# Patient Record
Sex: Female | Born: 2000 | Race: Black or African American | Hispanic: No | Marital: Single
Health system: Southern US, Community
[De-identification: ages and names within clinical notes are randomized; demographics above are authoritative.]

## PROBLEM LIST (undated history)

## (undated) DIAGNOSIS — Z789 Other specified health status: Secondary | ICD-10-CM

---
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
## 2022-09-28 ENCOUNTER — Encounter (HOSPITAL_COMMUNITY): Payer: Self-pay

## 2022-09-28 ENCOUNTER — Emergency Department (HOSPITAL_COMMUNITY)
Admission: EM | Admit: 2022-09-28 | Discharge: 2022-09-28 | Disposition: A | Discharge status: Home / Self Care | Payer: Medicaid Other | Attending: Emergency Medicine | Admitting: Emergency Medicine

## 2022-09-28 ENCOUNTER — Other Ambulatory Visit: Payer: Self-pay

## 2022-09-28 DIAGNOSIS — J029 Acute pharyngitis, unspecified: Secondary | ICD-10-CM | POA: Insufficient documentation

## 2022-09-28 LAB — GROUP A STREP BY PCR: Group A Strep by PCR: NOT DETECTED

## 2022-09-28 NOTE — ED Provider Notes (Signed)
[facility] AT [facility] Provider Note   CSN: [id number] Arrival date & time: 09/28/22  1602     History  Chief Complaint  Patient presents with   Sore Throat    Heather Ware is a 22 y.o. female.  Patient is a 22 year old female with no significant medical history who is presenting today with very minimal sore throat but when she looked in her throat today she reports there was a lot of white stuff on her tonsils.  Patient reports on Wednesday she felt a little sick and had some mild discomfort with swallowing.  However she denies fever, cough, congestion, sneezing, inability to swallow, voice changes, ear pain.  She has no history of strep throat no known sick contacts.  The history is provided by the patient.  Sore Throat       Home Medications Prior to Admission medications   Medication Sig Start Date End Date Taking? Authorizing Provider  ferrous sulfate 325 (65 FE) MG tablet Take 325 mg by mouth daily with breakfast. 08/29/22 08/29/23 Yes [provider]  ibuprofen (ADVIL) 200 MG tablet Take 200 mg by mouth every 6 (six) hours as needed for mild pain.   Yes [provider]  famotidine (PEPCID) 20 MG tablet Take 1 tablet (20 mg total) by mouth 2 (two) times daily. 06/03/17 07/13/17  Kem Parkinson, MD  predniSONE (DELTASONE) 10 MG tablet 50mg  (5 pills) daily x 1 wk, 40mg  daily x 1 wk, 30mg  daily x 1 wk, 20mg  daily x 1 wk, 10mg  daily x 1 wk, 10mg  every other day x 5 days. Patient not taking: Reported on 09/28/2022 06/04/17   Kem Parkinson, MD      Allergies    Patient has no known allergies.    Review of Systems   Review of Systems  Physical Exam Updated Vital Signs BP 139/89   Pulse 76   Temp 98.9 F (37.2 C) (Oral)   Resp 16   SpO2 99%  Physical Exam Vitals and nursing note reviewed.  Constitutional:      General: She is not in acute distress.    Appearance: She is well-developed.  HENT:     Head:  Normocephalic and atraumatic.     Right Ear: Tympanic membrane normal.     Left Ear: Tympanic membrane normal.     Mouth/Throat:     Mouth: Mucous membranes are moist.     Tonsils: Tonsillar exudate present. 2+ on the right. 2+ on the left.  Eyes:     Pupils: Pupils are equal, round, and reactive to light.  Cardiovascular:     Rate and Rhythm: Normal rate and regular rhythm.     Heart sounds: Normal heart sounds. No murmur heard.    No friction rub.  Pulmonary:     Effort: Pulmonary effort is normal.     Breath sounds: Normal breath sounds. No wheezing or rales.  Abdominal:     General: Bowel sounds are normal. There is no distension.     Palpations: Abdomen is soft.     Tenderness: There is no abdominal tenderness. There is no guarding or rebound.  Musculoskeletal:        General: No tenderness. Normal range of motion.     Comments: No edema  Lymphadenopathy:     Cervical: No cervical adenopathy.  Skin:    General: Skin is warm and dry.     Findings: No rash.  Neurological:     Mental  Status: She is alert and oriented to person, place, and time.     Cranial Nerves: No cranial nerve deficit.  Psychiatric:        Behavior: Behavior normal.     ED Results / Procedures / Treatments   Labs (all labs ordered are listed, but only abnormal results are displayed) Labs Reviewed  GROUP A STREP BY PCR    EKG None  Radiology No results found.  Procedures Procedures    Medications Ordered in ED Medications - No data to display  ED Course/ Medical Decision Making/ A&P                             Medical Decision Making  Patient presenting today with complaints of issues with her tonsils.  She had some mild discomfort with swallowing and felt mildly ill over the last few days.  On exam patient has significant exudates of her bilateral tonsils with some mild erythema and swelling.  She does not have cervical adenopathy or fever.  She does not have stridor or difficulty  swallowing.  She is otherwise well-appearing. 8:08 PM Strep screen is negative.  Findings discussed with the patient.  At this time will avoid antibiotics.  Patient to continue gargling with salt water, Tylenol or ibuprofen as needed.  Should resolve spontaneously.        Final Clinical Impression(s) / ED Diagnoses Final diagnoses:  Pharyngitis, unspecified etiology    Rx / DC Orders ED Discharge Orders     None         Gwyneth Sprout, MD 09/28/22 2008

## 2022-09-28 NOTE — Discharge Instructions (Signed)
No strep today.  You can continue to gargle if you want and you can take ibuprofen or tylenol as needed for pain.  It should get better on it's own.

## 2022-09-28 NOTE — ED Triage Notes (Signed)
Pt came in via POV d/t waking up this morning ignoring "feeling sick" & when she looked at the back of her throat while in her car her tonsils "looked weird." A/Ox4, rates her throat pain 3/10, denies recent fevers or any difficulty swallowing.
